# Patient Record
Sex: Female | Born: 1950 | Race: White | Hispanic: No | Marital: Married | Smoking: Former smoker
Health system: Southern US, Community
[De-identification: ages and names within clinical notes are randomized; demographics above are authoritative.]

## PROBLEM LIST (undated history)

## (undated) DIAGNOSIS — Z8639 Personal history of other endocrine, nutritional and metabolic disease: Secondary | ICD-10-CM

## (undated) DIAGNOSIS — C801 Malignant (primary) neoplasm, unspecified: Secondary | ICD-10-CM

## (undated) DIAGNOSIS — I10 Essential (primary) hypertension: Secondary | ICD-10-CM

## (undated) DIAGNOSIS — N952 Postmenopausal atrophic vaginitis: Secondary | ICD-10-CM

## (undated) DIAGNOSIS — E039 Hypothyroidism, unspecified: Secondary | ICD-10-CM

## (undated) HISTORY — PX: COLONOSCOPY: SHX174

## (undated) HISTORY — DX: Personal history of other endocrine, nutritional and metabolic disease: Z86.39

## (undated) HISTORY — PX: OTHER SURGICAL HISTORY: SHX169

## (undated) HISTORY — DX: Essential (primary) hypertension: I10

---
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
## 1978-04-09 HISTORY — PX: TREATMENT FISTULA ANAL: SUR1390

## 2002-04-09 HISTORY — PX: ABLATION: SHX5711

## 2004-06-23 ENCOUNTER — Ambulatory Visit: Payer: Self-pay

## 2005-04-09 DIAGNOSIS — Z8639 Personal history of other endocrine, nutritional and metabolic disease: Secondary | ICD-10-CM

## 2005-04-09 HISTORY — DX: Personal history of other endocrine, nutritional and metabolic disease: Z86.39

## 2005-08-16 ENCOUNTER — Ambulatory Visit: Payer: Self-pay | Admitting: Internal Medicine

## 2005-09-04 ENCOUNTER — Ambulatory Visit: Payer: Self-pay

## 2006-09-11 ENCOUNTER — Ambulatory Visit: Payer: Self-pay

## 2006-12-16 ENCOUNTER — Ambulatory Visit: Payer: Self-pay | Admitting: Internal Medicine

## 2007-06-04 ENCOUNTER — Ambulatory Visit: Payer: Self-pay | Admitting: Internal Medicine

## 2007-09-15 ENCOUNTER — Ambulatory Visit: Payer: Self-pay

## 2007-10-02 ENCOUNTER — Ambulatory Visit: Payer: Self-pay | Admitting: Gastroenterology

## 2008-07-01 ENCOUNTER — Ambulatory Visit: Payer: Self-pay

## 2008-09-16 ENCOUNTER — Ambulatory Visit: Payer: Self-pay

## 2009-03-22 ENCOUNTER — Ambulatory Visit: Payer: Self-pay | Admitting: Internal Medicine

## 2009-09-22 ENCOUNTER — Ambulatory Visit: Payer: Self-pay

## 2010-09-28 ENCOUNTER — Ambulatory Visit: Payer: Self-pay | Admitting: Internal Medicine

## 2011-10-02 ENCOUNTER — Ambulatory Visit: Payer: Self-pay | Admitting: Internal Medicine

## 2012-10-02 ENCOUNTER — Ambulatory Visit: Payer: Self-pay | Admitting: Internal Medicine

## 2013-10-06 ENCOUNTER — Ambulatory Visit: Payer: Self-pay | Admitting: Internal Medicine

## 2014-02-04 ENCOUNTER — Ambulatory Visit: Payer: Self-pay | Admitting: Internal Medicine

## 2014-10-05 ENCOUNTER — Other Ambulatory Visit: Payer: Self-pay | Admitting: Internal Medicine

## 2014-10-05 DIAGNOSIS — Z1239 Encounter for other screening for malignant neoplasm of breast: Secondary | ICD-10-CM

## 2014-10-12 ENCOUNTER — Ambulatory Visit
Admission: RE | Admit: 2014-10-12 | Discharge: 2014-10-12 | Disposition: A | Discharge status: Home / Self Care | Payer: BC Managed Care – PPO | Source: Ambulatory Visit | Attending: Internal Medicine | Admitting: Internal Medicine

## 2014-10-12 DIAGNOSIS — Z1239 Encounter for other screening for malignant neoplasm of breast: Secondary | ICD-10-CM

## 2014-10-12 DIAGNOSIS — Z1231 Encounter for screening mammogram for malignant neoplasm of breast: Secondary | ICD-10-CM | POA: Insufficient documentation

## 2015-07-25 ENCOUNTER — Other Ambulatory Visit: Payer: Self-pay | Admitting: Internal Medicine

## 2015-08-01 ENCOUNTER — Other Ambulatory Visit: Payer: Self-pay | Admitting: Internal Medicine

## 2015-08-01 DIAGNOSIS — Z1231 Encounter for screening mammogram for malignant neoplasm of breast: Secondary | ICD-10-CM

## 2015-10-13 ENCOUNTER — Ambulatory Visit
Admission: RE | Admit: 2015-10-13 | Discharge: 2015-10-13 | Disposition: A | Discharge status: Home / Self Care | Payer: Medicare Other | Source: Ambulatory Visit | Attending: Internal Medicine | Admitting: Internal Medicine

## 2015-10-13 DIAGNOSIS — Z1231 Encounter for screening mammogram for malignant neoplasm of breast: Secondary | ICD-10-CM

## 2016-08-01 ENCOUNTER — Other Ambulatory Visit: Payer: Self-pay | Admitting: Internal Medicine

## 2016-08-01 DIAGNOSIS — Z1231 Encounter for screening mammogram for malignant neoplasm of breast: Secondary | ICD-10-CM

## 2016-10-16 ENCOUNTER — Ambulatory Visit: Payer: Medicare Other

## 2016-10-16 ENCOUNTER — Ambulatory Visit
Admission: RE | Admit: 2016-10-16 | Discharge: 2016-10-16 | Disposition: A | Discharge status: Home / Self Care | Payer: Medicare Other | Source: Ambulatory Visit | Attending: Internal Medicine | Admitting: Internal Medicine

## 2016-10-16 DIAGNOSIS — Z1231 Encounter for screening mammogram for malignant neoplasm of breast: Secondary | ICD-10-CM | POA: Insufficient documentation

## 2016-10-16 HISTORY — DX: Malignant (primary) neoplasm, unspecified: C80.1

## 2016-12-06 ENCOUNTER — Ambulatory Visit (INDEPENDENT_AMBULATORY_CARE_PROVIDER_SITE_OTHER): Payer: Medicare Other | Admitting: Certified Nurse Midwife

## 2016-12-06 ENCOUNTER — Encounter: Payer: Self-pay | Admitting: Certified Nurse Midwife

## 2016-12-06 VITALS — BP 136/84 | HR 75 | Ht 61.5 in | Wt 152.9 lb

## 2016-12-06 DIAGNOSIS — R82998 Other abnormal findings in urine: Secondary | ICD-10-CM

## 2016-12-06 DIAGNOSIS — N898 Other specified noninflammatory disorders of vagina: Secondary | ICD-10-CM | POA: Diagnosis not present

## 2016-12-06 DIAGNOSIS — L292 Pruritus vulvae: Secondary | ICD-10-CM

## 2016-12-06 DIAGNOSIS — R8299 Other abnormal findings in urine: Secondary | ICD-10-CM | POA: Diagnosis not present

## 2016-12-06 MED ORDER — LIDOCAINE HCL 2 % EX GEL: 1.0000 "application " | CUTANEOUS | 2 refills | Status: DC | PRN

## 2016-12-06 MED ORDER — HYDROXYZINE HCL 25 MG PO TABS: 25.0000 mg | ORAL_TABLET | Freq: Four times a day (QID) | ORAL | 2 refills | Status: DC | PRN

## 2016-12-06 MED ORDER — CLOBETASOL PROPIONATE 0.05 % EX OINT: TOPICAL_OINTMENT | CUTANEOUS | 5 refills | Status: DC

## 2016-12-06 NOTE — Patient Instructions (Signed)
Lichen Sclerosus  Lichen sclerosus is a skin problem. It can happen on any part of the body, but it commonly involves the anal or genital areas. It can cause itching and discomfort in these areas. Treatment can help to control symptoms. When the genital area is affected, getting treatment is important because the condition can cause scarring that may lead to other problems.  What are the causes?  The cause of this condition is not known. It could be the result of an overactive immune system or a lack of certain hormones. Lichen sclerosus is not an infection or a fungus. It is not passed from one person to another (not contagious).  What increases the risk?  This condition is more likely to develop in women, usually after menopause.  What are the signs or symptoms?  Symptoms of this condition include:  · Thin, wrinkled, white areas on the skin.  · Thickened white areas on the skin.  · Red and swollen patches (lesions) on the skin.  · Tears or cracks in the skin.  · Bruising.  · Blood blisters.  · Severe itching.    You may also have pain, itching, or burning with urination. Constipation is also common in people with lichen sclerosus.  How is this diagnosed?  This condition may be diagnosed with a physical exam. In some cases, a tissue sample (biopsy sample) may be removed to be looked at under a microscope.  How is this treated?  This condition is usually treated with medicated creams or ointments (topical steroids) that are applied over the affected areas.  Follow these instructions at home:  · Take over-the-counter and prescription medicines only as told by your health care provider.  · Use creams or ointments as told by your health care provider.  · Do not scratch the affected areas of skin.  · Women should keep the vaginal area as clean and dry as possible.  · Keep all follow-up visits as told by your health care provider. This is important.  Contact a health care provider if:  · You have increasing redness,  swelling, or pain in the affected area.  · You have fluid, blood, or pus coming from the affected area.  · You have new lesions on your skin.  · You have a fever.  · You have pain during sex.  This information is not intended to replace advice given to you by your health care provider. Make sure you discuss any questions you have with your health care provider.  Document Released: 08/16/2010 Document Revised: 09/01/2015 Document Reviewed: 06/21/2014  Elsevier Interactive Patient Education © 2018 Elsevier Inc[employer]

## 2016-12-09 LAB — URINE CULTURE

## 2016-12-12 LAB — CANDIDA 6 SPECIES PROFILE, NAA
CANDIDA ALBICANS, NAA: NEGATIVE
CANDIDA GLABRATA, NAA: NEGATIVE
CANDIDA KRUSEI, NAA: NEGATIVE
CANDIDA LUSITANIAE, NAA: NEGATIVE
CANDIDA TROPICALIS, NAA: NEGATIVE
Candida parapsilosis, NAA: NEGATIVE

## 2016-12-12 LAB — BACTERIAL VAGINOSIS, NAA

## 2016-12-12 LAB — PATHOLOGY

## 2016-12-16 NOTE — Progress Notes (Signed)
GYN ENCOUNTER NOTE  Subjective:       Deborah Waters is a 66 y.o. [phone] female here for evaluation of vaginal dryness, itching and swelling for the last few months. Symptoms are unrelieved with home and medical treatment measures.   Treated for yeast and bacterial vaginosis by PCP. Also, prescribed Estrace cream.   Reports feeling normal for seven (7) to 10 days, but then the symptoms returned.   History significant for hypothyroidism.   Denies difficulty breathing or respiratory distress, chest pain, abdominal pain, vaginal bleeding, vaginal discharge, vaginal odor, dysuria, and leg pain or swelling.    Gynecologic History  No LMP recorded. Patient is postmenopausal.   Last mammogram: 10/2016. Results were: normal  Obstetric History  OB History  Gravida Para Term Preterm AB Living  3 2 2   1 2   SAB TAB Ectopic Multiple Live Births    1     2    # Outcome Date GA Lbr Len/2nd Weight Sex Delivery Anes PTL Lv  3 Term 06/09/82    M Vag-Spont   LIV  2 Term 10/05/76    F Vag-Spont   LIV  1 TAB 1975        ND      Past Medical History:  Diagnosis Date  . Cancer ([place])    skin ca-basal cell  . History of hypothyroidism 2007    Past Surgical History:  Procedure Laterality Date  . ABLATION  2004   uterine  . TONSILLECTOMY  1969  . TREATMENT FISTULA ANAL  1980   No Known Allergies  Social History   Social History  . Marital status: Married    Spouse name: N/A  . Number of children: N/A  . Years of education: N/A   Occupational History  . Not on file.   Social History Main Topics  . Smoking status: Former Research scientist (life sciences)  . Smokeless tobacco: Never Used  . Alcohol use 4.2 oz/week    7 Glasses of wine per week  . Drug use: No  . Sexual activity: Yes    Partners: Male   Other Topics Concern  . Not on file   Social History Narrative  . No narrative on file    Family History  Problem Relation Age of Onset  . Breast cancer Paternal Aunt 50  . Cancer Mother         bone  . Cancer Father        lung  . Heart failure Brother   . Hypertension Paternal Grandfather   . Stroke Paternal Grandfather     The following portions of the patient's history were reviewed and updated as appropriate: allergies, current medications, past family history, past medical history, past social history, past surgical history and problem list.  Review of Systems  Review of Systems - Negative except as noted above.  History obtained from the patient.   Objective:   BP 136/84   Pulse 75   Ht 5' 1.5" (1.562 m)   Wt 152 lb 14.4 oz (69.4 kg)   BMI 28.42 kg/m    CONSTITUTIONAL: Well-developed, well-nourished female in no acute distress.    SKIN: Skin is warm and dry. No rash noted. Not diaphoretic. No erythema. No pallor.  [place]: Alert and oriented to person, place, and time.   PSYCHIATRIC: Normal mood and affect. Normal behavior. Normal judgment and thought content.  ABDOMEN: Soft, non distended; Non tender.  No Organomegaly.  PELVIC:  External Genitalia: Erythema present  Vagina:  Erythema present   Assessment:   1. Vaginal irritation  - Candida 6 Species Profile, NAA - Bacterial Vaginosis, NAA - Pathology  2. Vulvar itching  - Candida 6 Species Profile, NAA - Bacterial Vaginosis, NAA - Pathology  3. Leukocytes in urine  - Urine Culture   Plan:   Labs: NuSwab, urine culture and punch biospy (see note below).  Rx: Clobetasol, hydroxyzine, lidocaine jelly; see orders.   Reviewed red flag symptoms and when to call.   RTC x 6 weeks for follow up or sooner if needed.    Diona Fanti, CNM   Procedure note:  After informed written consent was obtained, using Betadine for cleansing and 1% Lidocaine with epinephrine for anesthetic, with sterile technique a 3 mm punch biopsy was used to obtain a biopsy specimen of the vulvar. Hemostasis was obtained by pressure and silver nitrate. Be alert for any signs of cutaneous  infection. The specimen is labeled and sent to pathology for evaluation. The procedure was well tolerated without complications.   Diona Fanti, CNM

## 2016-12-17 ENCOUNTER — Encounter: Payer: Self-pay | Admitting: Certified Nurse Midwife

## 2017-01-03 ENCOUNTER — Encounter: Payer: Self-pay | Admitting: Certified Nurse Midwife

## 2017-01-04 NOTE — Telephone Encounter (Signed)
Ivin Booty, please contact patient. May come in earlier for me to look at spot. Thanks, JML

## 2017-01-07 ENCOUNTER — Telehealth: Payer: Self-pay | Admitting: Certified Nurse Midwife

## 2017-01-10 ENCOUNTER — Encounter: Payer: Self-pay | Admitting: Certified Nurse Midwife

## 2017-01-10 ENCOUNTER — Ambulatory Visit (INDEPENDENT_AMBULATORY_CARE_PROVIDER_SITE_OTHER): Payer: Medicare Other | Admitting: Certified Nurse Midwife

## 2017-01-10 VITALS — BP 155/97 | HR 70 | Wt 150.5 lb

## 2017-01-10 DIAGNOSIS — N9089 Other specified noninflammatory disorders of vulva and perineum: Secondary | ICD-10-CM | POA: Diagnosis not present

## 2017-01-10 DIAGNOSIS — R3915 Urgency of urination: Secondary | ICD-10-CM | POA: Diagnosis not present

## 2017-01-10 DIAGNOSIS — R3913 Splitting of urinary stream: Secondary | ICD-10-CM

## 2017-01-10 LAB — POCT URINALYSIS DIPSTICK
Bilirubin, UA: NEGATIVE
GLUCOSE UA: NEGATIVE
Ketones, UA: NEGATIVE
Nitrite, UA: NEGATIVE
Protein, UA: NEGATIVE
RBC UA: NEGATIVE
SPEC GRAV UA: 1.015 (ref 1.010–1.025)
Urobilinogen, UA: 0.2 E.U./dL
pH, UA: 6 (ref 5.0–8.0)

## 2017-01-11 ENCOUNTER — Encounter: Payer: Self-pay | Admitting: Certified Nurse Midwife

## 2017-01-14 LAB — ANAEROBIC AND AEROBIC CULTURE

## 2017-01-14 LAB — HERPES SIMPLEX VIRUS CULTURE

## 2017-01-16 NOTE — Progress Notes (Signed)
GYN ENCOUNTER NOTE  Subjective:       Deborah Waters is a 66 y.o. [phone] female here for gynecologic evaluation of vulvar lesion or pustule.   Reports doing well with clobestal ointment treatment until she noticed three (3) small pin head sized pustules to her vulva.   Denies difficulty breathing or respiratory distress, chest pain, abdominal pain, dysuria, vaginal bleeding, and leg pain or swelling.   Gynecologic History  No LMP recorded. Patient is postmenopausal.  Contraception: post menopausal status  Obstetric History  OB History  Gravida Para Term Preterm AB Living  3 2 2   1 2   SAB TAB Ectopic Multiple Live Births    1     2    # Outcome Date GA Lbr Len/2nd Weight Sex Delivery Anes PTL Lv  3 Term 06/09/82    M Vag-Spont   LIV  2 Term 10/05/76    F Vag-Spont   LIV  1 TAB 1975        ND      Past Medical History:  Diagnosis Date  . Cancer ([place])    skin ca-basal cell  . History of hypothyroidism 2007    Past Surgical History:  Procedure Laterality Date  . ABLATION  2004   uterine  . TONSILLECTOMY  1969  . TREATMENT FISTULA ANAL  1980    Current Outpatient Prescriptions on File Prior to Visit  Medication Sig Dispense Refill  . bisoprolol-hydrochlorothiazide (ZIAC) 5-6.25 MG tablet Take 1 tablet by mouth daily.    . clobetasol ointment (TEMOVATE) 0.05 % Apply to affected area every night for 4 weeks, then every other day for 4 weeks and then twice a week for 4 weeks or until resolution. 30 g 5  . estradiol (ESTRACE) 0.1 MG/GM vaginal cream Place 1 Applicatorful vaginally once a week.    . hydrOXYzine (ATARAX/VISTARIL) 25 MG tablet Take 1 tablet (25 mg total) by mouth every 6 (six) hours as needed for itching. 30 tablet 2  . lidocaine (XYLOCAINE) 2 % jelly Apply 1 application topically as needed. 30 mL 2   No current facility-administered medications on file prior to visit.     No Known Allergies  Social History   Social History  . Marital status:  Married    Spouse name: N/A  . Number of children: N/A  . Years of education: N/A   Occupational History  . Not on file.   Social History Main Topics  . Smoking status: Former Research scientist (life sciences)  . Smokeless tobacco: Never Used  . Alcohol use 4.2 oz/week    7 Glasses of wine per week  . Drug use: No  . Sexual activity: Yes    Partners: Male   Other Topics Concern  . Not on file   Social History Narrative  . No narrative on file    Family History  Problem Relation Age of Onset  . Breast cancer Paternal Aunt 59  . Cancer Mother        bone  . Cancer Father        lung  . Heart failure Brother   . Hypertension Paternal Grandfather   . Stroke Paternal Grandfather     The following portions of the patient's history were reviewed and updated as appropriate: allergies, current medications, past family history, past medical history, past social history, past surgical history and problem list.  Review of Systems  Review of Systems - Negative except as noted above.  History obtained from the patient.  Objective:   BP (!) 155/97   Pulse 70   Wt 150 lb 8 oz (68.3 kg)   BMI 27.98 kg/m    CONSTITUTIONAL: Well-developed, well-nourished female in no acute distress.   HENT:  Normocephalic, atraumatic.   NECK: Normal range of motion, supple, no masses.    SKIN: Skin is warm and dry. No rash noted. Not diaphoretic. No erythema. No pallor.  [place]: Alert and oriented to person, place, and time.   PSYCHIATRIC: Normal mood and affect. Normal behavior. Normal judgment and thought content.  CARDIOVASCULAR:Not Examined  RESPIRATORY: Not Examined  BREASTS: Not Examined  ABDOMEN: Soft, non distended; Non tender.  No Organomegaly.  PELVIC:   External Genitalia: Erythema noted, three pin head  sized pustules drained and culture obtained    Vagina: Normal   MUSCULOSKELETAL: Normal range of motion. No tenderness.  No cyanosis, clubbing, or edema.  Assessment:   1. Urgency of  urination - POCT urinalysis dipstick - Ambulatory referral to Urology  2. Urinary urgency  3. Urinary stream splitting - Ambulatory referral to Urology  4. Lesion of vulva - Anaerobic and Aerobic Culture - Herpes simplex virus culture   Plan:   Labs: Cultures obtained, see orders.   Continue medications as prescribed.   Reviewed red flag symptoms and when to call.   RTC as needed if symptoms worsen or fail to improve.    Diona Fanti, CNM

## 2017-01-17 ENCOUNTER — Encounter: Payer: Medicare Other | Admitting: Certified Nurse Midwife

## 2017-01-18 ENCOUNTER — Other Ambulatory Visit: Payer: Self-pay | Admitting: Certified Nurse Midwife

## 2017-01-18 MED ORDER — SULFAMETHOXAZOLE-TRIMETHOPRIM 800-160 MG PO TABS: 1.0000 | ORAL_TABLET | Freq: Two times a day (BID) | ORAL | 0 refills | Status: DC

## 2017-01-21 ENCOUNTER — Encounter: Payer: Self-pay | Admitting: Certified Nurse Midwife

## 2017-01-21 ENCOUNTER — Telehealth: Payer: Self-pay | Admitting: Certified Nurse Midwife

## 2017-01-21 NOTE — Telephone Encounter (Signed)
Patient called and lvm that she would like to have her prescription sent to [facility] in [place], because her preferred pharmacy is currently closed.  The patient would like to have a call back to confirm her medication problem. No other information was disclosed. Please advise.

## 2017-01-21 NOTE — Telephone Encounter (Signed)
Attempted to reach pt on other number not able to reach

## 2017-01-21 NOTE — Telephone Encounter (Signed)
Attempted to reach pt, but recording stating that call could not be completed. Will try to call pt back

## 2017-01-30 NOTE — Telephone Encounter (Signed)
Attempted to reach pt was unable to get through on phone

## 2017-02-04 NOTE — Telephone Encounter (Signed)
Will close message please see pt mychart message.

## 2017-02-07 ENCOUNTER — Other Ambulatory Visit: Payer: Self-pay

## 2017-02-07 DIAGNOSIS — R3915 Urgency of urination: Secondary | ICD-10-CM

## 2017-02-08 ENCOUNTER — Ambulatory Visit (INDEPENDENT_AMBULATORY_CARE_PROVIDER_SITE_OTHER): Payer: Medicare Other | Admitting: Urology

## 2017-02-08 ENCOUNTER — Other Ambulatory Visit
Admission: RE | Admit: 2017-02-08 | Discharge: 2017-02-08 | Disposition: A | Discharge status: Home / Self Care | Payer: Medicare Other | Source: Ambulatory Visit | Attending: Urology | Admitting: Urology

## 2017-02-08 ENCOUNTER — Encounter: Payer: Self-pay | Admitting: Urology

## 2017-02-08 VITALS — BP 137/71 | HR 68 | Ht 61.0 in | Wt 150.0 lb

## 2017-02-08 DIAGNOSIS — R32 Unspecified urinary incontinence: Secondary | ICD-10-CM

## 2017-02-08 DIAGNOSIS — N8111 Cystocele, midline: Secondary | ICD-10-CM | POA: Diagnosis not present

## 2017-02-08 DIAGNOSIS — R3915 Urgency of urination: Secondary | ICD-10-CM | POA: Diagnosis present

## 2017-02-08 DIAGNOSIS — N393 Stress incontinence (female) (male): Secondary | ICD-10-CM | POA: Diagnosis not present

## 2017-02-08 LAB — URINALYSIS, COMPLETE (UACMP) WITH MICROSCOPIC
BILIRUBIN URINE: NEGATIVE
Glucose, UA: NEGATIVE mg/dL
Ketones, ur: NEGATIVE mg/dL
NITRITE: NEGATIVE
PROTEIN: NEGATIVE mg/dL
Specific Gravity, Urine: 1.015 (ref 1.005–1.030)
pH: 6 (ref 5.0–8.0)

## 2017-02-08 LAB — BLADDER SCAN AMB NON-IMAGING

## 2017-02-08 NOTE — Progress Notes (Signed)
02/08/2017 3:22 PM   Deborah Waters Jul 02, 1950 [id number]  Referring provider: Albina Billet, MD [address]   [place], [place] [address]  Chief Complaint  Patient presents with  . Urinary Incontinence    New Patient    HPI: 66 year old female who presents today for further evaluation of stress urinary incontinence as well as urinary issues.  She reports that for many years, she had a very small amount of leakage of urine with laughing coughing, sneezing, and exercise.  Is not been bothersome to her.  She underwent endometrial ablation sometime ago and after that, started wearing a panty liner.  She believes her pantiliner may have been mildly damp but this is not bother her.  More recently, she has had vulvar irritation which has been evaluated and treated by GYN.  Because of this, she stopped wearing her panty liner thinking this may be exacerbating her symptoms.  Since then, she is noticed that her panties are damp and she was worried about this.    Additionally today, she reports that after voiding, she has a sensation of a "bubble" at her urethra with termination.  After a few seconds, the sensation goes away.  During this time when she feels the "bubble" she dribbles a little bit of urine.  This is not particularly bothersome to her but she was asked to see urology for further evaluation of this.  She does have a history of fairly significant vaginal dryness.  She was started on topical estrogen cream which she uses half applicatorful twice a week which is helpful.  She denies any pain with intercourse.  She does still have an intact uterus.  She had 2 vaginal deliveries.  Dysuria or gross hematuria.  No UTIs.  No bowel issues.   PMH: Past Medical History:  Diagnosis Date  . Cancer ([place])    skin ca-basal cell  . History of hypothyroidism 2007  . Hypertension     Surgical History: Past Surgical History:  Procedure Laterality Date  . ABLATION  2004   uterine  . TONSILLECTOMY  1969  . TREATMENT FISTULA ANAL  1980    Home Medications:  Allergies as of 02/08/2017   No Known Allergies     Medication List       Accurate as of 02/08/17  3:22 PM. Always use your most recent med list.          bisoprolol-hydrochlorothiazide 5-6.25 MG tablet Commonly known as:  ZIAC Take 1 tablet by mouth daily.   calcium-vitamin D 250-100 MG-UNIT tablet Take 1 tablet by mouth 2 (two) times daily.   estradiol 0.1 MG/GM vaginal cream Commonly known as:  ESTRACE Place 1 Applicatorful vaginally once a week.   ibuprofen 100 MG tablet Commonly known as:  ADVIL,MOTRIN Take 100 mg by mouth every 6 (six) hours as needed for fever.       Allergies: No Known Allergies  Family History: Family History  Problem Relation Age of Onset  . Breast cancer Paternal Aunt 37  . Cancer Mother        bone  . Cancer Father        lung  . Heart failure Brother   . Hypertension Paternal Grandfather   . Stroke Paternal Grandfather     Social History:  reports that she has quit smoking. She has never used smokeless tobacco. She reports that she drinks about 4.2 oz of alcohol per week . She reports that she does not use drugs.  ROS: UROLOGY Frequent Urination?: No Hard to postpone urination?: No Burning/pain with urination?: No Get up at night to urinate?: Yes Leakage of urine?: Yes Urine stream starts and stops?: No Trouble starting stream?: No Do you have to strain to urinate?: No Blood in urine?: No Urinary tract infection?: No Sexually transmitted disease?: No Injury to kidneys or bladder?: No Painful intercourse?: No Weak stream?: No Currently pregnant?: No Vaginal bleeding?: No Last menstrual period?: n  Gastrointestinal Nausea?: No Vomiting?: No Indigestion/heartburn?: No Diarrhea?: No Constipation?: No  Constitutional Fever: No Night sweats?: No Weight loss?: No Fatigue?: No  Skin Skin rash/lesions?: Yes Itching?:  Yes  Eyes Blurred vision?: No Double vision?: No  Ears/Nose/Throat Sore throat?: No Sinus problems?: Yes  Hematologic/Lymphatic Swollen glands?: No Easy bruising?: No  Cardiovascular Leg swelling?: No Chest pain?: No  Respiratory Cough?: No Shortness of breath?: No  Endocrine Excessive thirst?: No  Musculoskeletal Back pain?: No Joint pain?: Yes  Neurological Headaches?: No Dizziness?: Yes  Psychologic Depression?: No Anxiety?: No  Physical Exam: BP 137/71   Pulse 68   Ht 5\' 1"  (1.549 m)   Wt 150 lb (68 kg)   BMI 28.34 kg/m   Constitutional:  Alert and oriented, No acute distress. HEENT: [place] AT, moist mucus membranes.  Trachea midline, no masses. Cardiovascular: No clubbing, cyanosis, or edema. Respiratory: Normal respiratory effort, no increased work of breathing. GI: Abdomen is soft, nontender, nondistended, no abdominal masses GU: No CVA tenderness.  Pelvic: Normal external genitalia.  Normal urethra which is hypermobile, significant movement but no demonstrable stress incontinence with Valsalva.  She does have fairly significant anterior descent, to the level of the vaginal introitus  Skin: No rashes, bruises or suspicious lesions. Neurologic: Grossly intact, no focal deficits, moving all 4 extremities. Psychiatric: Normal mood and affect.  Laboratory Data: n/a  Urinalysis  Component     Latest Ref Rng & Units 02/08/2017  Color, Urine     YELLOW YELLOW  Appearance     CLEAR HAZY (A)  Specific Gravity, Urine     1.005 - 1.030 1.015  pH     5.0 - 8.0 6.0  Glucose     NEGATIVE mg/dL NEGATIVE  Hgb urine dipstick     NEGATIVE TRACE (A)  Bilirubin Urine     NEGATIVE NEGATIVE  Ketones, ur     NEGATIVE mg/dL NEGATIVE  Protein     NEGATIVE mg/dL NEGATIVE  Nitrite     NEGATIVE NEGATIVE  Leukocytes, UA     NEGATIVE LARGE (A)  Squamous Epithelial / LPF     NONE SEEN TOO NUMEROUS TO COUNT (A)  WBC, UA     0 - 5 WBC/hpf 6-30  RBC / HPF      0 - 5 RBC/hpf 0-5  Bacteria, UA     NONE SEEN FEW (A)   Pertinent Imaging: Results for orders placed or performed in visit on 02/08/17  BLADDER SCAN AMB NON-IMAGING  Result Value Ref Range   Scan Result 10ml     Assessment & Plan:    1. Stress incontinence Mild stress urinary incontinence, minimal bother We discussed options for management of this including physical therapy, Kegel exercises, and surgical intervention Weight loss was also discussed At this point in time, she was given information on pelvic floor exercises which she will start at home She will let us know she like to pursue physical therapy She is not interested in surgical intervention - BLADDER SCAN AMB NON-IMAGING - Urine culture; Future  2. Cystocele, midline Fairly significant cystocele with Valsalva Suspect the pressure and a "bubble" sensation she feels near the end of voiding is descent of her bladder to the introitus which resolves with decreased intra-abdominal pressure Discussed options for cystocele including conservative management, pessary, and surgical intervention She preferred conservative management at this time He does ever become interested in pursuing other alternative treatments, recommend following up with Dr. Matilde Sprang  F/u prn  Hollice Espy, MD  [facility] [address], [place], [place] [address] [phone]

## 2017-03-28 ENCOUNTER — Ambulatory Visit: Payer: Medicare Other | Admitting: Certified Nurse Midwife

## 2017-03-28 ENCOUNTER — Encounter: Payer: Self-pay | Admitting: Certified Nurse Midwife

## 2017-03-28 VITALS — BP 151/82 | HR 73 | Ht 61.0 in | Wt 155.7 lb

## 2017-03-28 DIAGNOSIS — L292 Pruritus vulvae: Secondary | ICD-10-CM | POA: Diagnosis not present

## 2017-03-28 DIAGNOSIS — N9089 Other specified noninflammatory disorders of vulva and perineum: Secondary | ICD-10-CM | POA: Diagnosis not present

## 2017-03-28 DIAGNOSIS — N898 Other specified noninflammatory disorders of vagina: Secondary | ICD-10-CM

## 2017-03-28 MED ORDER — METRONIDAZOLE 500 MG PO TABS: 500.0000 mg | ORAL_TABLET | Freq: Two times a day (BID) | ORAL | 0 refills | Status: AC

## 2017-03-28 MED ORDER — ESTRADIOL 0.1 MG/GM VA CREA: 1.0000 | TOPICAL_CREAM | Freq: Every day | VAGINAL | 6 refills | Status: DC

## 2017-03-28 NOTE — Patient Instructions (Signed)
Atrophic Vaginitis Atrophic vaginitis is when the tissues that line the vagina become dry and thin. This is caused by a drop in estrogen. Estrogen helps:  To keep the vagina moist.  To make a clear fluid that helps: ? To lubricate the vagina for sex. ? To protect the vagina from infection.  If the lining of the vagina is dry and thin, it may:  Make sex painful. It may also cause bleeding.  Cause a feeling of: ? Burning. ? Irritation. ? Itchiness.  Make an exam of your vagina painful. It may also cause bleeding.  Make you lose interest in sex.  Cause a burning feeling when you pee.  Make your vaginal fluid (discharge) brown or yellow.  For some women, there are no symptoms. This condition is most common in women who do not get their regular menstrual periods anymore (menopause). This often starts when a woman is 74-53 years old. Follow these instructions at home:  Take medicines only as told by your doctor. Do not use any herbal or alternative medicines unless your doctor says it is okay.  Use over-the-counter products for dryness only as told by your doctor. These include: ? Creams. ? Lubricants. ? Moisturizers.  Do not douche.  Do not use products that can make your vagina dry. These include: ? Scented feminine sprays. ? Scented tampons. ? Scented soaps.  If it hurts to have sex, tell your sexual partner. Contact a doctor if:  Your discharge looks different than normal.  Your vagina has an unusual smell.  You have new symptoms.  Your symptoms do not get better with treatment.  Your symptoms get worse. This information is not intended to replace advice given to you by your health care provider. Make sure you discuss any questions you have with your health care provider. Document Released: 09/12/2007 Document Revised: 09/01/2015 Document Reviewed: 03/17/2014 Elsevier Interactive Patient Education  2018 [employer]. Vaginitis Vaginitis is an inflammation of  the vagina. It can happen when the normal bacteria and yeast in the vagina grow too much. There are different types. Treatment will depend on the type you have. Follow these instructions at home:  Take all medicines as told by your doctor.  Keep your vagina area clean and dry. Avoid soap. Rinse the area with water.  Avoid washing and cleaning out the vagina (douching).  Do not use tampons or have sex (intercourse) until your treatment is done.  Wipe from front to back after going to the restroom.  Wear cotton underwear.  Avoid wearing underwear while you sleep until your vaginitis is gone.  Avoid tight pants. Avoid underwear or nylons without a cotton panel.  Take off wet clothing (such as a bathing suit) as soon as you can.  Use mild, unscented products. Avoid fabric softeners and scented: ? Feminine sprays. ? Laundry detergents. ? Tampons. ? Soaps or bubble baths.  Practice safe sex and use condoms. Get help right away if:  You have belly (abdominal) pain.  You have a fever or lasting symptoms for more than 2-3 days.  You have a fever and your symptoms suddenly get worse. This information is not intended to replace advice given to you by your health care provider. Make sure you discuss any questions you have with your health care provider. Document Released: 06/22/2008 Document Revised: 09/01/2015 Document Reviewed: 09/06/2011 Elsevier Interactive Patient Education  2017 [employer]. Metronidazole tablets or capsules What is this medicine? METRONIDAZOLE (me troe NI da zole) is an antiinfective. It  is used to treat certain kinds of bacterial and protozoal infections. It will not work for colds, flu, or other viral infections. This medicine may be used for other purposes; ask your health care provider or pharmacist if you have questions. COMMON BRAND NAME(S): Flagyl What should I tell my health care provider before I take this medicine? They need to know if you have any  of these conditions: -anemia or other blood disorders -disease of the nervous system -fungal or yeast infection -if you drink alcohol containing drinks -liver disease -seizures -an unusual or allergic reaction to metronidazole, or other medicines, foods, dyes, or preservatives -pregnant or trying to get pregnant -breast-feeding How should I use this medicine? Take this medicine by mouth with a full glass of water. Follow the directions on the prescription label. Take your medicine at regular intervals. Do not take your medicine more often than directed. Take all of your medicine as directed even if you think you are better. Do not skip doses or stop your medicine early. Talk to your pediatrician regarding the use of this medicine in children. Special care may be needed. Overdosage: If you think you have taken too much of this medicine contact a poison control center or emergency room at once. NOTE: This medicine is only for you. Do not share this medicine with others. What if I miss a dose? If you miss a dose, take it as soon as you can. If it is almost time for your next dose, take only that dose. Do not take double or extra doses. What may interact with this medicine? Do not take this medicine with any of the following medications: -alcohol or any product that contains alcohol -amprenavir oral solution -cisapride -disulfiram -dofetilide -dronedarone -paclitaxel injection -pimozide -ritonavir oral solution -sertraline oral solution -sulfamethoxazole-trimethoprim injection -thioridazine -ziprasidone This medicine may also interact with the following medications: -birth control pills -cimetidine -lithium -other medicines that prolong the QT interval (cause an abnormal heart rhythm) -phenobarbital -phenytoin -warfarin This list may not describe all possible interactions. Give your health care provider a list of all the medicines, herbs, non-prescription drugs, or dietary  supplements you use. Also tell them if you smoke, drink alcohol, or use illegal drugs. Some items may interact with your medicine. What should I watch for while using this medicine? Tell your doctor or health care professional if your symptoms do not improve or if they get worse. You may get drowsy or dizzy. Do not drive, use machinery, or do anything that needs mental alertness until you know how this medicine affects you. Do not stand or sit up quickly, especially if you are an older patient. This reduces the risk of dizzy or fainting spells. Avoid alcoholic drinks while you are taking this medicine and for three days afterward. Alcohol may make you feel dizzy, sick, or flushed. If you are being treated for a sexually transmitted disease, avoid sexual contact until you have finished your treatment. Your sexual partner may also need treatment. What side effects may I notice from receiving this medicine? Side effects that you should report to your doctor or health care professional as soon as possible: -allergic reactions like skin rash or hives, swelling of the face, lips, or tongue -confusion, clumsiness -difficulty speaking -discolored or sore mouth -dizziness -fever, infection -numbness, tingling, pain or weakness in the hands or feet -trouble passing urine or change in the amount of urine -redness, blistering, peeling or loosening of the skin, including inside the mouth -seizures -unusually weak or  tired -vaginal irritation, dryness, or discharge Side effects that usually do not require medical attention (report to your doctor or health care professional if they continue or are bothersome): -diarrhea -headache -irritability -metallic taste -nausea -stomach pain or cramps -trouble sleeping This list may not describe all possible side effects. Call your doctor for medical advice about side effects. You may report side effects to FDA at 1-800-FDA-1088. Where should I keep my  medicine? Keep out of the reach of children. Store at room temperature below 25 degrees C (77 degrees F). Protect from light. Keep container tightly closed. Throw away any unused medicine after the expiration date. NOTE: This sheet is a summary. It may not cover all possible information. If you have questions about this medicine, talk to your doctor, pharmacist, or health care provider.  2018 Elsevier/Gold Standard (2012-10-31 14:08:39)

## 2017-03-30 NOTE — Progress Notes (Signed)
GYN ENCOUNTER NOTE  Subjective:       Deborah Waters is a 66 y.o. [phone] female here for gynecologic evaluation of the following issues:  1. Vaginal discharge 2. Vaginal/vulvar irritation 3. Vaginal dryness 4. Vulvar itching  Reports itching and yellow, sticky discharge for the last 11 days. Attempted treatment with OTC Monistat x two (2) episodes with minimal relief of symptoms.   Questions current Estrace dosing.   Denies difficulty breathing or respiratory distress, chest pain, abdominal pain, vaginal bleeding, dysuria, and leg pain or swelling.    Gynecologic History  No LMP recorded. Patient is postmenopausal.   Last mammogram: 10/2016. Results were: normal.   Obstetric History  OB History  Gravida Para Term Preterm AB Living  3 2 2   1 2   SAB TAB Ectopic Multiple Live Births    1     2    # Outcome Date GA Lbr Len/2nd Weight Sex Delivery Anes PTL Lv  3 Term 06/09/82    M Vag-Spont   LIV  2 Term 10/05/76    F Vag-Spont   LIV  1 TAB 1975        ND      Past Medical History:  Diagnosis Date  . Cancer ([place])    skin ca-basal cell  . History of hypothyroidism 2007  . Hypertension     Past Surgical History:  Procedure Laterality Date  . ABLATION  2004   uterine  . TONSILLECTOMY  1969  . TREATMENT FISTULA ANAL  1980    Current Outpatient Medications on File Prior to Visit  Medication Sig Dispense Refill  . bisoprolol-hydrochlorothiazide (ZIAC) 5-6.25 MG tablet Take 1 tablet by mouth daily.    . calcium-vitamin D 250-100 MG-UNIT tablet Take 1 tablet by mouth 2 (two) times daily.    Marland Kitchen ibuprofen (ADVIL,MOTRIN) 100 MG tablet Take 100 mg by mouth every 6 (six) hours as needed for fever.    . Psyllium (METAMUCIL FIBER PO) Take by mouth.     No current facility-administered medications on file prior to visit.     No Known Allergies  Social History   Socioeconomic History  . Marital status: Married    Spouse name: Not on file  . Number of children:  Not on file  . Years of education: Not on file  . Highest education level: Not on file  Social Needs  . Financial resource strain: Not on file  . Food insecurity - worry: Not on file  . Food insecurity - inability: Not on file  . Transportation needs - medical: Not on file  . Transportation needs - non-medical: Not on file  Occupational History  . Not on file  Tobacco Use  . Smoking status: Former Research scientist (life sciences)  . Smokeless tobacco: Never Used  Substance and Sexual Activity  . Alcohol use: Yes    Alcohol/week: 4.2 oz    Types: 7 Glasses of wine per week  . Drug use: No  . Sexual activity: Yes    Partners: Male  Other Topics Concern  . Not on file  Social History Narrative  . Not on file    Family History  Problem Relation Age of Onset  . Breast cancer Paternal Aunt 9  . Cancer Mother        bone  . Cancer Father        lung  . Heart failure Brother   . Hypertension Paternal Grandfather   . Stroke Paternal Grandfather  The following portions of the patient's history were reviewed and updated as appropriate: allergies, current medications, past family history, past medical history, past social history, past surgical history and problem list.  Review of Systems Review of Systems - Negative except as noted above. History obtained from the patient.  Objective:   BP (!) 151/82 (BP Location: Left Arm, Patient Position: Sitting, Cuff Size: Normal)   Pulse 73   Ht 5\' 1"  (1.549 m)   Wt 155 lb 11.2 oz (70.6 kg)   BMI 29.42 kg/m    CONSTITUTIONAL: Well-developed, well-nourished female in no acute distress.   HENT:  Normocephalic, atraumatic.   NECK: Normal range of motion, supple, no masses.  Normal thyroid.   SKIN: Skin is warm and dry. No rash noted. Not diaphoretic. No erythema. No pallor.  [place]: Alert and oriented to person, place, and time.   PSYCHIATRIC: Normal mood and affect. Normal behavior. Normal judgment and thought content.  ABDOMEN: Soft, non  distended; Non tender.  No Organomegaly.  PELVIC:  External Genitalia: Erythema noted  Vagina: Discharge present, positive whiff test  Cervix: Normal  Uterus: Normal size, shape,consistency, mobile  Adnexa: Normal   MUSCULOSKELETAL: Normal range of motion. No tenderness.  No cyanosis, clubbing, or edema.  Assessment:   1. Vaginal discharge  - NuSwab Vaginitis (VG)  2. Vaginal irritation  - NuSwab Vaginitis (VG) - estradiol (ESTRACE) 0.1 MG/GM vaginal cream; Place 1 Applicatorful vaginally at bedtime.  Dispense: 42.5 g; Refill: 6  3. Vaginal dryness   4. Vulvar irritation   5. Vulvar itching  - estradiol (ESTRACE) 0.1 MG/GM vaginal cream; Place 1 Applicatorful vaginally at bedtime.  Dispense: 42.5 g; Refill: 6   Plan:   Labs: NuSwab, see orders.   Discussed usage options for estrace cream including current regimen vs advised regimen.   Encouraged home vaginal health techniques.   Rx: Estrace, see orders.   Rx: Flagyl, see orders.   Reviewed red flag symptoms and when to call.   RTC if symptoms worsen or fail to improve.    Diona Fanti, CNM [facility], [facility]

## 2017-04-03 LAB — NUSWAB VAGINITIS (VG)
CANDIDA ALBICANS, NAA: NEGATIVE
Candida glabrata, NAA: NEGATIVE
Trich vag by NAA: NEGATIVE

## 2017-04-10 ENCOUNTER — Encounter: Payer: Self-pay | Admitting: Certified Nurse Midwife

## 2017-04-15 ENCOUNTER — Encounter: Payer: Self-pay | Admitting: Certified Nurse Midwife

## 2017-04-17 ENCOUNTER — Other Ambulatory Visit: Payer: Self-pay

## 2017-04-17 DIAGNOSIS — N898 Other specified noninflammatory disorders of vagina: Secondary | ICD-10-CM

## 2017-04-17 DIAGNOSIS — L292 Pruritus vulvae: Secondary | ICD-10-CM

## 2017-04-17 MED ORDER — ESTRADIOL 0.1 MG/GM VA CREA: 1.0000 | TOPICAL_CREAM | Freq: Every day | VAGINAL | 0 refills | Status: AC

## 2017-04-22 ENCOUNTER — Telehealth: Payer: Self-pay | Admitting: Certified Nurse Midwife

## 2017-04-22 NOTE — Telephone Encounter (Signed)
Pt states her estrace cream is a tier 3. A 90 day supply is 192.00. If she can get it switched to a tier 1 it will only be 80.00 for a 90 days supply. Advised pt to have insurance company fax over a tier exception form and I will fill it out. Pt given our fax number.

## 2017-04-22 NOTE — Telephone Encounter (Signed)
Patient called stating she needs a nurse to call her insurance to request a tier exception for estradiol so she can get it cheaper. She would like for you to call her to explain the situation. Thanks

## 2017-04-25 ENCOUNTER — Other Ambulatory Visit: Payer: Self-pay

## 2017-04-25 MED ORDER — ESTROGENS, CONJUGATED 0.625 MG/GM VA CREA: 0.5000 | TOPICAL_CREAM | VAGINAL | 1 refills | Status: DC

## 2017-06-26 ENCOUNTER — Encounter: Payer: Self-pay | Admitting: Certified Nurse Midwife

## 2017-06-27 NOTE — Telephone Encounter (Signed)
Error

## 2017-07-02 ENCOUNTER — Telehealth: Payer: Self-pay | Admitting: Obstetrics and Gynecology

## 2017-07-02 MED ORDER — ESTROGENS, CONJUGATED 0.625 MG/GM VA CREA: 0.5000 | TOPICAL_CREAM | VAGINAL | 1 refills | Status: AC

## 2017-07-02 NOTE — Telephone Encounter (Signed)
The patient called and stated that she would like to speak with Crystal M. No other information was disclosed. Please advise.

## 2017-07-02 NOTE — Telephone Encounter (Signed)
Pt states she has been using the premarin 2 x a week. The other days she has been using lavender and coconut oil. She states she does not leave home d/t the mess the she makes in her underwear. She states there has got to be something else. Advised pt to look into replens. She is wondering should she take estrogen po. Advised appt- made for 07/12/2017. Refill of premarin erx.

## 2017-07-12 ENCOUNTER — Encounter: Payer: Medicare Other | Admitting: Certified Nurse Midwife

## 2017-07-29 ENCOUNTER — Encounter: Payer: Self-pay | Admitting: Certified Nurse Midwife

## 2017-07-29 ENCOUNTER — Ambulatory Visit (INDEPENDENT_AMBULATORY_CARE_PROVIDER_SITE_OTHER): Payer: Medicare Other | Admitting: Certified Nurse Midwife

## 2017-07-29 VITALS — BP 150/89 | HR 68 | Ht 61.0 in | Wt 153.9 lb

## 2017-07-29 DIAGNOSIS — N898 Other specified noninflammatory disorders of vagina: Secondary | ICD-10-CM

## 2017-07-29 NOTE — Progress Notes (Signed)
Pt has noticed dryness for about 1 year. Pt has been using coconut oil but noticed that she is using it every 2-3 for relief of dryness if she has clothes on but when she don't clothes dry her vaginal area out.

## 2017-07-29 NOTE — Patient Instructions (Signed)
Atrophic Vaginitis Atrophic vaginitis is when the tissues that line the vagina become dry and thin. This is caused by a drop in estrogen. Estrogen helps:  To keep the vagina moist.  To make a clear fluid that helps: ? To lubricate the vagina for sex. ? To protect the vagina from infection.  If the lining of the vagina is dry and thin, it may:  Make sex painful. It may also cause bleeding.  Cause a feeling of: ? Burning. ? Irritation. ? Itchiness.  Make an exam of your vagina painful. It may also cause bleeding.  Make you lose interest in sex.  Cause a burning feeling when you pee.  Make your vaginal fluid (discharge) brown or yellow.  For some women, there are no symptoms. This condition is most common in women who do not get their regular menstrual periods anymore (menopause). This often starts when a woman is 45-55 years old. Follow these instructions at home:  Take medicines only as told by your doctor. Do not use any herbal or alternative medicines unless your doctor says it is okay.  Use over-the-counter products for dryness only as told by your doctor. These include: ? Creams. ? Lubricants. ? Moisturizers.  Do not douche.  Do not use products that can make your vagina dry. These include: ? Scented feminine sprays. ? Scented tampons. ? Scented soaps.  If it hurts to have sex, tell your sexual partner. Contact a doctor if:  Your discharge looks different than normal.  Your vagina has an unusual smell.  You have new symptoms.  Your symptoms do not get better with treatment.  Your symptoms get worse. This information is not intended to replace advice given to you by your health care provider. Make sure you discuss any questions you have with your health care provider. Document Released: 09/12/2007 Document Revised: 09/01/2015 Document Reviewed: 03/17/2014 Elsevier Interactive Patient Education  2018 Elsevier Inc[employer]  

## 2017-07-31 LAB — NUSWAB VAGINITIS (VG)
CANDIDA ALBICANS, NAA: NEGATIVE
CANDIDA GLABRATA, NAA: NEGATIVE
TRICH VAG BY NAA: NEGATIVE

## 2017-08-04 NOTE — Progress Notes (Signed)
GYN ENCOUNTER NOTE  Subjective:       Deborah Waters is a 67 y.o. [phone] female is here for gynecologic evaluation of the following issues:  1. Vaginal dryness 2. Vaginal itching 3. Vaginal irritation  Reports symptoms despite treatment measures. Applying coconut oil approximately four (4) times a day. Denies noticeable difference using Premarin or Estrace.   Denies difficulty breathing or respiratory distress, chest pain, abdominal pain, vaginal bleeding, dysuria, and leg pain or swelling.    Gynecologic History  No LMP recorded. Patient is postmenopausal.  Last mammogram: 10/2016. Results were: normal  Obstetric History OB History  Gravida Para Term Preterm AB Living  3 2 2   1 2   SAB TAB Ectopic Multiple Live Births    1     2    # Outcome Date GA Lbr Len/2nd Weight Sex Delivery Anes PTL Lv  3 Term 06/09/82    M Vag-Spont   LIV  2 Term 10/05/76    F Vag-Spont   LIV  1 TAB 1975        ND    Past Medical History:  Diagnosis Date  . Cancer ([place])    skin ca-basal cell  . History of hypothyroidism 2007  . Hypertension     Past Surgical History:  Procedure Laterality Date  . ABLATION  2004   uterine  . TONSILLECTOMY  1969  . TREATMENT FISTULA ANAL  1980    Current Outpatient Medications on File Prior to Visit  Medication Sig Dispense Refill  . bisoprolol-hydrochlorothiazide (ZIAC) 5-6.25 MG tablet Take 1 tablet by mouth daily.    . calcium-vitamin D 250-100 MG-UNIT tablet Take 1 tablet by mouth 2 (two) times daily.    Marland Kitchen conjugated estrogens (PREMARIN) vaginal cream Place 0.5 Applicatorfuls vaginally 2 (two) times a week. 90 g 1  . estradiol (ESTRACE) 0.1 MG/GM vaginal cream Place 1 Applicatorful vaginally at bedtime. 127.8 g 0  . ibuprofen (ADVIL,MOTRIN) 100 MG tablet Take 100 mg by mouth every 6 (six) hours as needed for fever.    . Multiple Vitamin (MULTIVITAMIN) tablet Take 1 tablet by mouth daily.    . Omega-3 Fatty Acids (FISH OIL) 1000 MG CAPS Take by  mouth.    . Psyllium (METAMUCIL FIBER PO) Take by mouth.     No current facility-administered medications on file prior to visit.     No Known Allergies  Social History   Socioeconomic History  . Marital status: Married    Spouse name: Not on file  . Number of children: Not on file  . Years of education: Not on file  . Highest education level: Not on file  Occupational History  . Not on file  Social Needs  . Financial resource strain: Not on file  . Food insecurity:    Worry: Not on file    Inability: Not on file  . Transportation needs:    Medical: Not on file    Non-medical: Not on file  Tobacco Use  . Smoking status: Former Research scientist (life sciences)  . Smokeless tobacco: Never Used  Substance and Sexual Activity  . Alcohol use: Yes    Alcohol/week: 4.2 oz    Types: 7 Glasses of wine per week  . Drug use: No  . Sexual activity: Yes    Partners: Male  Lifestyle  . Physical activity:    Days per week: Not on file    Minutes per session: Not on file  . Stress: Not on file  Relationships  .  Social connections:    Talks on phone: Not on file    Gets together: Not on file    Attends religious service: Not on file    Active member of club or organization: Not on file    Attends meetings of clubs or organizations: Not on file    Relationship status: Not on file  . Intimate partner violence:    Fear of current or ex partner: Not on file    Emotionally abused: Not on file    Physically abused: Not on file    Forced sexual activity: Not on file  Other Topics Concern  . Not on file  Social History Narrative  . Not on file    Family History  Problem Relation Age of Onset  . Breast cancer Paternal Aunt 58  . Cancer Mother        bone  . Cancer Father        lung  . Heart failure Brother   . Hypertension Paternal Grandfather   . Stroke Paternal Grandfather     The following portions of the patient's history were reviewed and updated as appropriate: allergies, current  medications, past family history, past medical history, past social history, past surgical history and problem list.  Review of Systems  Review of Systems - Negative except as noted above.  History obtained from the patient   Objective:   BP (!) 150/89   Pulse 68   Ht 5\' 1"  (1.549 m)   Wt 153 lb 14.4 oz (69.8 kg)   BMI 29.08 kg/m    CONSTITUTIONAL: Well-developed, well-nourished female in no acute distress.   HENT:  Normocephalic, atraumatic.   [place]: Alert and oriented to person, place, and time.   PSYCHIATRIC: Normal mood and affect. Normal behavior. Normal judgment and thought content.  PELVIC:  External Genitalia: Erythema noted  Vagina: Thick, white discharge present  Cervix: Normal   MUSCULOSKELETAL: Normal range of motion. No tenderness.  No cyanosis, clubbing, or edema.  Assessment:   1. Vaginal dryness  - NuSwab Vaginitis (VG)  2. Vaginal itching  - NuSwab Vaginitis (VG)  3. Vaginal irritation   Plan:   Patient would like to take oral estrogen for treatment of symptoms due to limited relief with vaginal estrogen.   Labs: NuSwab, will contact patient with results.   Advised patient would consult Dr. Marcelline Mates regarding best oral treatment for vaginal dryness then contact patient with medication options.   Reviewed red flag symptoms and when to call.   RTC as needed.    Diona Fanti, CNM [facility], [facility]

## 2017-08-05 ENCOUNTER — Encounter: Payer: Self-pay | Admitting: Certified Nurse Midwife

## 2017-08-15 ENCOUNTER — Encounter: Payer: Self-pay | Admitting: Certified Nurse Midwife

## 2017-08-16 ENCOUNTER — Other Ambulatory Visit: Payer: Self-pay | Admitting: Internal Medicine

## 2017-08-16 DIAGNOSIS — Z1231 Encounter for screening mammogram for malignant neoplasm of breast: Secondary | ICD-10-CM

## 2017-08-20 ENCOUNTER — Telehealth: Payer: Self-pay | Admitting: Obstetrics and Gynecology

## 2017-08-20 MED ORDER — OSPEMIFENE 60 MG PO TABS: 60.0000 mg | ORAL_TABLET | Freq: Every day | ORAL | 2 refills | Status: AC

## 2017-08-20 NOTE — Telephone Encounter (Signed)
Rx sent to [facility]- mail order. Pt given the number to [facility]. She is advised to contact them and tell them she is a cash paying pt. Per coupon card pt is to get 90 day supply for 80.00. Pt will call me back if any issues. Will give samples to tide pt over until rx arrives. Pt very appreciative of call and help.

## 2017-08-20 NOTE — Telephone Encounter (Signed)
Patient called requesting to speak with you regarding a medication.Thanks

## 2017-10-17 ENCOUNTER — Ambulatory Visit
Admission: RE | Admit: 2017-10-17 | Discharge: 2017-10-17 | Disposition: A | Discharge status: Home / Self Care | Payer: Medicare Other | Source: Ambulatory Visit | Attending: Internal Medicine | Admitting: Internal Medicine

## 2017-10-17 DIAGNOSIS — Z1231 Encounter for screening mammogram for malignant neoplasm of breast: Secondary | ICD-10-CM | POA: Insufficient documentation

## 2018-05-29 ENCOUNTER — Ambulatory Visit
Admission: RE | Admit: 2018-05-29 | Discharge: 2018-05-29 | Disposition: A | Discharge status: Home / Self Care | Payer: Medicare Other | Attending: Gastroenterology | Admitting: Gastroenterology

## 2018-05-29 ENCOUNTER — Encounter: Payer: Self-pay | Admitting: Student

## 2018-05-29 ENCOUNTER — Ambulatory Visit: Payer: Medicare Other | Admitting: Anesthesiology

## 2018-05-29 ENCOUNTER — Encounter
Admission: RE | Disposition: A | Discharge status: Home / Self Care | Payer: Self-pay | Source: Home / Self Care | Attending: Gastroenterology

## 2018-05-29 ENCOUNTER — Other Ambulatory Visit: Payer: Self-pay

## 2018-05-29 DIAGNOSIS — Z79890 Hormone replacement therapy (postmenopausal): Secondary | ICD-10-CM | POA: Insufficient documentation

## 2018-05-29 DIAGNOSIS — Z1211 Encounter for screening for malignant neoplasm of colon: Secondary | ICD-10-CM | POA: Insufficient documentation

## 2018-05-29 DIAGNOSIS — Z79899 Other long term (current) drug therapy: Secondary | ICD-10-CM | POA: Insufficient documentation

## 2018-05-29 DIAGNOSIS — N952 Postmenopausal atrophic vaginitis: Secondary | ICD-10-CM | POA: Insufficient documentation

## 2018-05-29 DIAGNOSIS — K621 Rectal polyp: Secondary | ICD-10-CM | POA: Diagnosis not present

## 2018-05-29 DIAGNOSIS — K635 Polyp of colon: Secondary | ICD-10-CM | POA: Insufficient documentation

## 2018-05-29 DIAGNOSIS — K5730 Diverticulosis of large intestine without perforation or abscess without bleeding: Secondary | ICD-10-CM | POA: Insufficient documentation

## 2018-05-29 DIAGNOSIS — I10 Essential (primary) hypertension: Secondary | ICD-10-CM | POA: Diagnosis not present

## 2018-05-29 DIAGNOSIS — Z87891 Personal history of nicotine dependence: Secondary | ICD-10-CM | POA: Diagnosis not present

## 2018-05-29 HISTORY — DX: Hypothyroidism, unspecified: E03.9

## 2018-05-29 HISTORY — DX: Postmenopausal atrophic vaginitis: N95.2

## 2018-05-29 HISTORY — PX: COLONOSCOPY WITH PROPOFOL: SHX5780

## 2018-05-29 SURGERY — COLONOSCOPY WITH PROPOFOL
Anesthesia: General

## 2018-05-29 MED ORDER — PROPOFOL 500 MG/50ML IV EMUL
INTRAVENOUS | Status: AC
  Filled 2018-05-29: qty 50

## 2018-05-29 MED ORDER — PROPOFOL 500 MG/50ML IV EMUL
INTRAVENOUS | Status: DC | PRN
  Administered 2018-05-29: 110 ug/kg/min via INTRAVENOUS

## 2018-05-29 MED ORDER — SODIUM CHLORIDE 0.9 % IV SOLN
INTRAVENOUS | Status: DC
  Administered 2018-05-29: 09:00:00 via INTRAVENOUS

## 2018-05-29 MED ORDER — PROPOFOL 10 MG/ML IV BOLUS
INTRAVENOUS | Status: DC | PRN
  Administered 2018-05-29: 80 mg via INTRAVENOUS

## 2018-05-29 NOTE — Anesthesia Preprocedure Evaluation (Signed)
Anesthesia Evaluation  Patient identified by MRN, date of birth, ID band Patient awake    Reviewed: Allergy & Precautions, H&P , NPO status , Patient's Chart, lab work & pertinent test results, reviewed documented beta blocker date and time   Airway Mallampati: II   Neck ROM: full    Dental  (+) Poor Dentition   Pulmonary neg pulmonary ROS, former smoker,    Pulmonary exam normal        Cardiovascular Exercise Tolerance: Good hypertension, On Medications negative cardio ROS Normal cardiovascular exam Rhythm:regular Rate:Normal     Neuro/Psych negative neurological ROS  negative psych ROS   GI/Hepatic negative GI ROS, Neg liver ROS,   Endo/Other  negative endocrine ROSHypothyroidism   Renal/GU negative Renal ROS  negative genitourinary   Musculoskeletal   Abdominal   Peds  Hematology negative hematology ROS (+)   Anesthesia Other Findings Past Medical History: No date: Cancer ([place])     Comment:  skin ca-basal cell 2007: History of hypothyroidism No date: Hypertension No date: Hypothyroidism No date: Vaginal atrophy Past Surgical History: 2004: ABLATION     Comment:  uterine No date: COLONOSCOPY 1969: TONSILLECTOMY 1980: TREATMENT FISTULA ANAL No date: uterine ablation BMI    Body Mass Index:  27.96 kg/m     Reproductive/Obstetrics negative OB ROS                             Anesthesia Physical Anesthesia Plan  ASA: II  Anesthesia Plan: General   Post-op Pain Management:    Induction:   PONV Risk Score and Plan:   Airway Management Planned:   Additional Equipment:   Intra-op Plan:   Post-operative Plan:   Informed Consent: I have reviewed the patients History and Physical, chart, labs and discussed the procedure including the risks, benefits and alternatives for the proposed anesthesia with the patient or authorized representative who has indicated his/her  understanding and acceptance.     Dental Advisory Given  Plan Discussed with: CRNA  Anesthesia Plan Comments:         Anesthesia Quick Evaluation

## 2018-05-29 NOTE — H&P (Signed)
Outpatient short stay form Pre-procedure 05/29/2018 8:38 AM Lollie Sails MD  Primary Physician: Dr. Benita Stabile  Reason for visit: Colonoscopy  History of present illness: Patient is a 68-year-old female presenting today for colonoscopy.  Her last procedure was 2009.  No family history of colon polyps or colon cancer patient had no polyps on that procedure.  She denies any problems with rectal bleeding abdominal pain or diarrhea.  Tolerating her prep well.  She takes no aspirin or blood thinning agent.    Current Facility-Administered Medications:  .  0.9 %  sodium chloride infusion, , Intravenous, Continuous, Lollie Sails, MD, Last Rate: 20 mL/hr at 05/29/18 8343  Medications Prior to Admission  Medication Sig Dispense Refill Last Dose  . bisoprolol-hydrochlorothiazide (ZIAC) 5-6.25 MG tablet Take 1 tablet by mouth daily.   05/29/2018 at 0600  . calcium-vitamin D 250-100 MG-UNIT tablet Take 1 tablet by mouth 2 (two) times daily.   05/28/2018 at Unknown time  . ibuprofen (ADVIL,MOTRIN) 100 MG tablet Take 100 mg by mouth every 6 (six) hours as needed for fever.   Past Week at Unknown time  . Multiple Vitamin (MULTIVITAMIN) tablet Take 1 tablet by mouth daily.   Past Week at Unknown time  . Psyllium (METAMUCIL FIBER PO) Take by mouth.   Past Week at Unknown time  . conjugated estrogens (PREMARIN) vaginal cream Place 0.5 Applicatorfuls vaginally 2 (two) times a week. 90 g 1 Taking  . estradiol (ESTRACE) 0.1 MG/GM vaginal cream Place 1 Applicatorful vaginally at bedtime. 127.8 g 0 Taking  . Omega-3 Fatty Acids (FISH OIL) 1000 MG CAPS Take by mouth.   Not Taking at Unknown time  . Ospemifene (OSPHENA) 60 MG TABS Take 60 mg by mouth daily. (Patient not taking: Reported on 05/29/2018) 90 tablet 2 Not Taking at Unknown time     No Known Allergies   Past Medical History:  Diagnosis Date  . Cancer ([place])    skin ca-basal cell  . History of hypothyroidism 2007  . Hypertension   .  Hypothyroidism   . Vaginal atrophy     Review of systems:      Physical Exam    Heart and lungs: Rhythm without rub or gallop, lungs are bilaterally clear.    HEENT: Normocephalic atraumatic eyes are anicteric    Other:    Pertinant exam for procedure: Soft nontender nondistended bowel sounds positive normoactive    Planned proceedures: Colonoscopy and indicated procedures. I have discussed the risks benefits and complications of procedures to include not limited to bleeding, infection, perforation and the risk of sedation and the patient wishes to proceed.    Lollie Sails, MD Gastroenterology 05/29/2018  8:38 AM

## 2018-05-29 NOTE — Op Note (Signed)
[facility] Gastroenterology Patient Name: Deborah Waters Procedure Date: 05/29/2018 8:36 AM MRN: [id number] Account #: [id number] Date of Birth: 1950/09/21 Admit Type: Outpatient Age: 68 Room: [facility] ENDO ROOM 1 Gender: Female Note Status: Finalized Procedure:            Colonoscopy Indications:          Screening for colorectal malignant neoplasm Providers:            Lollie Sails, MD Referring MD:         Leona Carry. Hall Busing, MD (Referring MD) Medicines:            Monitored Anesthesia Care Complications:        No immediate complications. Procedure:            Pre-Anesthesia Assessment:                       - ASA Grade Assessment: II - A patient with mild                        systemic disease.                       After obtaining informed consent, the colonoscope was                        passed under direct vision. Throughout the procedure,                        the patient's blood pressure, pulse, and oxygen                        saturations were monitored continuously. The                        Colonoscope was introduced through the anus and                        advanced to the the cecum, identified by appendiceal                        orifice and ileocecal valve. The colonoscopy was                        performed without difficulty. The patient tolerated the                        procedure well. The quality of the bowel preparation                        was good. Findings:      Four sessile polyps were found in the rectum. The polyps were 1 to 2 mm       in size. These polyps were removed with a cold biopsy forceps. Resection       and retrieval were complete.      Four sessile polyps were found in the distal sigmoid colon. The polyps       were 1 to 2 mm in size. These polyps were removed with a cold biopsy       forceps. Resection and retrieval were complete.      A 1 mm polyp was found in  the cecum. The polyp was sessile. The polyp   was removed with a cold biopsy forceps. Resection and retrieval were       complete.      Multiple small-mouthed diverticula were found in the sigmoid colon and       descending colon.      The retroflexed view of the distal rectum and anal verge was normal and       showed no anal or rectal abnormalities.      The digital rectal exam was normal. Impression:           - Four 1 to 2 mm polyps in the rectum, removed with a                        cold biopsy forceps. Resected and retrieved.                       - Four 1 to 2 mm polyps in the distal sigmoid colon,                        removed with a cold biopsy forceps. Resected and                        retrieved.                       - One 1 mm polyp in the cecum, removed with a cold                        biopsy forceps. Resected and retrieved.                       - Diverticulosis in the sigmoid colon and in the                        descending colon.                       - The distal rectum and anal verge are normal on                        retroflexion view. Recommendation:       - Discharge patient to home.                       - Advance diet as tolerated.                       - Await pathology results.                       - Telephone GI clinic for pathology results in 5 days. Procedure Code(s):    --- Professional ---                       [phone], Colonoscopy, flexible; with biopsy, single or                        multiple Diagnosis Code(s):    --- Professional ---                       Z12.11, Encounter for  screening for malignant neoplasm                        of colon                       K62.1, Rectal polyp                       D12.5, Benign neoplasm of sigmoid colon                       D12.0, Benign neoplasm of cecum                       K57.30, Diverticulosis of large intestine without                        perforation or abscess without bleeding CPT copyright 2018 [employer]. All  rights reserved. The codes documented in this report are preliminary and upon coder review may  be revised to meet current compliance requirements. Lollie Sails, MD 05/29/2018 9:24:59 AM This report has been signed electronically. Number of Addenda: 0 Note Initiated On: 05/29/2018 8:36 AM Scope Withdrawal Time: 0 hours 14 minutes 1 second  Total Procedure Duration: 0 hours 23 minutes 47 seconds       [facility]

## 2018-05-29 NOTE — Anesthesia Post-op Follow-up Note (Signed)
Anesthesia QCDR form completed.        

## 2018-05-29 NOTE — Transfer of Care (Signed)
Immediate Anesthesia Transfer of Care Note  Patient: Deborah Waters  Procedure(s) Performed: COLONOSCOPY WITH PROPOFOL (N/A )  Patient Location: Endoscopy Unit  Anesthesia Type:General  Level of Consciousness: drowsy and patient cooperative  Airway & Oxygen Therapy: Patient Spontanous Breathing and Patient connected to nasal cannula oxygen  Post-op Assessment: Report given to RN and Post -op Vital signs reviewed and stable  Post vital signs: Reviewed and stable  Last Vitals:  Vitals Value Taken Time  BP 97/68 05/29/2018  9:28 AM  Temp 36.2 C 05/29/2018  9:28 AM  Pulse 70 05/29/2018  9:34 AM  Resp 19 05/29/2018  9:34 AM  SpO2 96 % 05/29/2018  9:34 AM  Vitals shown include unvalidated device data.  Last Pain:  Vitals:   05/29/18 0928  TempSrc: Tympanic  PainSc: 0-No pain         Complications: No apparent anesthesia complications

## 2018-05-30 ENCOUNTER — Encounter: Payer: Self-pay | Admitting: Gastroenterology

## 2018-05-30 LAB — SURGICAL PATHOLOGY

## 2018-05-30 NOTE — Anesthesia Postprocedure Evaluation (Signed)
Anesthesia Post Note  Patient: Deborah Waters  Procedure(s) Performed: COLONOSCOPY WITH PROPOFOL (N/A )  Patient location during evaluation: PACU Anesthesia Type: General Level of consciousness: awake and alert Pain management: pain level controlled Vital Signs Assessment: post-procedure vital signs reviewed and stable Respiratory status: spontaneous breathing, nonlabored ventilation, respiratory function stable and patient connected to nasal cannula oxygen Cardiovascular status: blood pressure returned to baseline and stable Postop Assessment: no apparent nausea or vomiting Anesthetic complications: no     Last Vitals:  Vitals:   05/29/18 0948 05/29/18 0958  BP: 105/67 114/66  Pulse: 65 61  Resp: 16 13  Temp:    SpO2: 94% 97%    Last Pain:  Vitals:   05/30/18 0731  TempSrc:   PainSc: 0-No pain                 Molli Barrows

## 2019-02-17 ENCOUNTER — Other Ambulatory Visit: Payer: Self-pay | Admitting: Internal Medicine

## 2019-02-17 DIAGNOSIS — Z1231 Encounter for screening mammogram for malignant neoplasm of breast: Secondary | ICD-10-CM

## 2019-05-20 ENCOUNTER — Ambulatory Visit
Admission: RE | Admit: 2019-05-20 | Discharge: 2019-05-20 | Disposition: A | Discharge status: Home / Self Care | Payer: Medicare PPO | Source: Ambulatory Visit | Attending: Internal Medicine | Admitting: Internal Medicine

## 2019-05-20 ENCOUNTER — Other Ambulatory Visit: Payer: Self-pay

## 2019-05-20 DIAGNOSIS — Z1231 Encounter for screening mammogram for malignant neoplasm of breast: Secondary | ICD-10-CM | POA: Diagnosis present

## 2019-08-08 DEATH — deceased

## 2020-08-12 IMAGING — MG DIGITAL SCREENING BILAT W/ TOMO W/ CAD
8 series · 8 of 24 positions shown · non-contrast
Comparison: Previous exam(s).

CLINICAL DATA: Screening.

EXAM:
DIGITAL SCREENING BILATERAL MAMMOGRAM WITH TOMO AND CAD

[R MLO synth-2D]
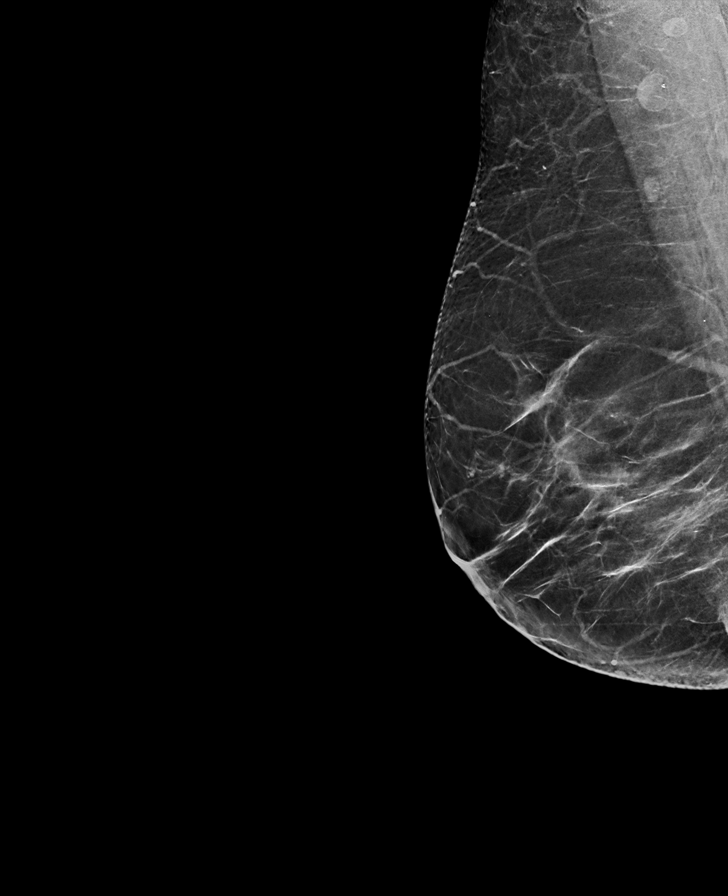

[R CC synth-2D]
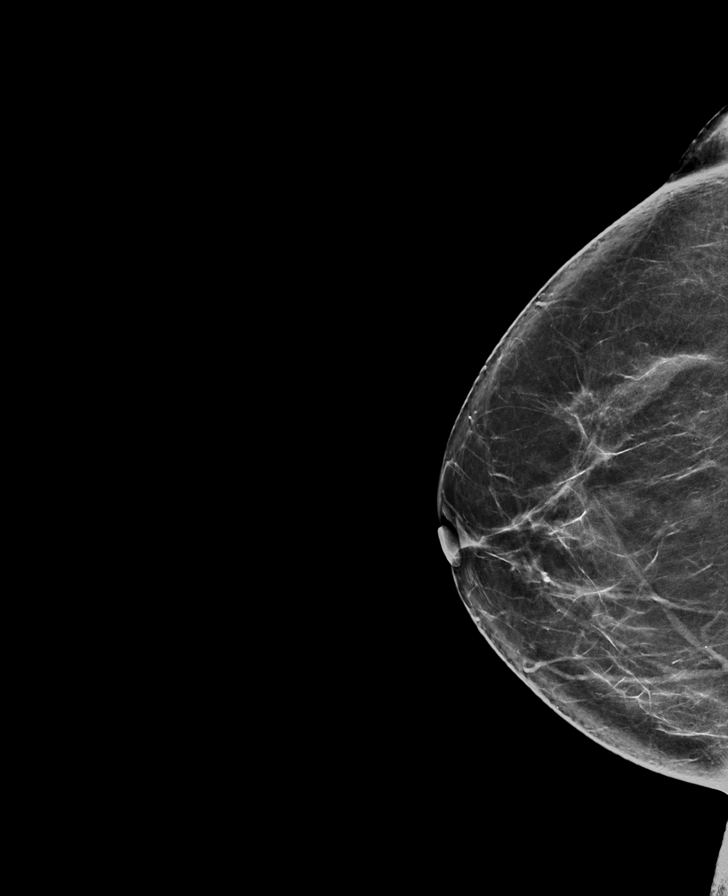

[L CC synth-2D]
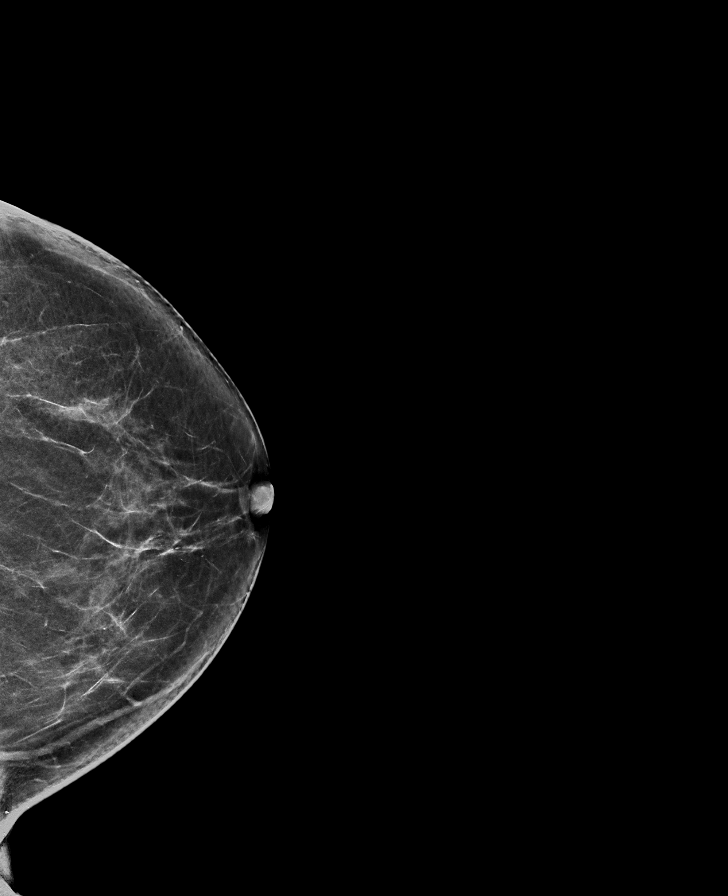

[L MLO synth-2D]
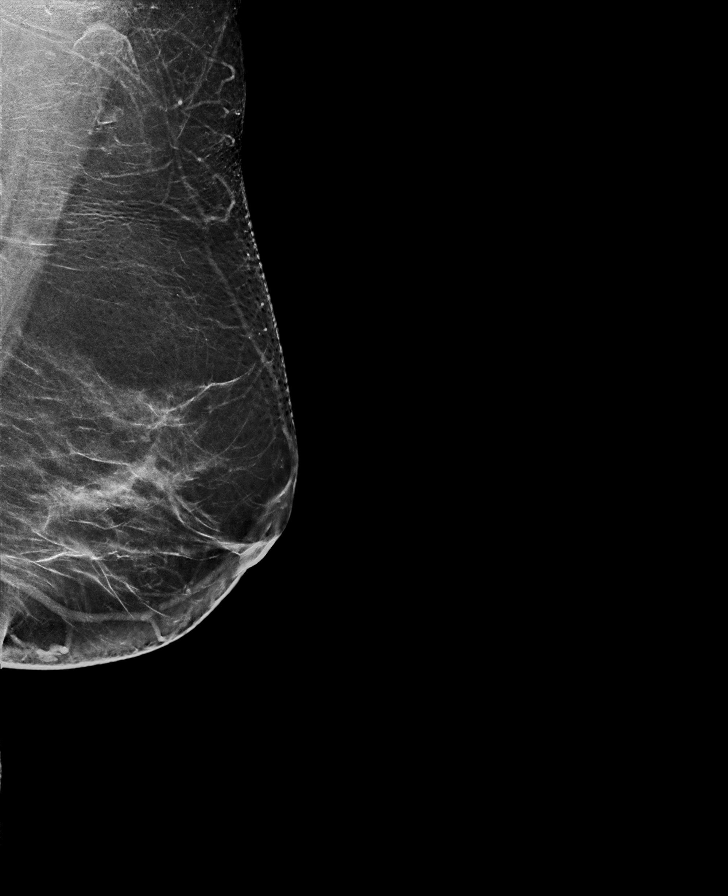

[R MLO tomo · tomo slice 37/72.0]
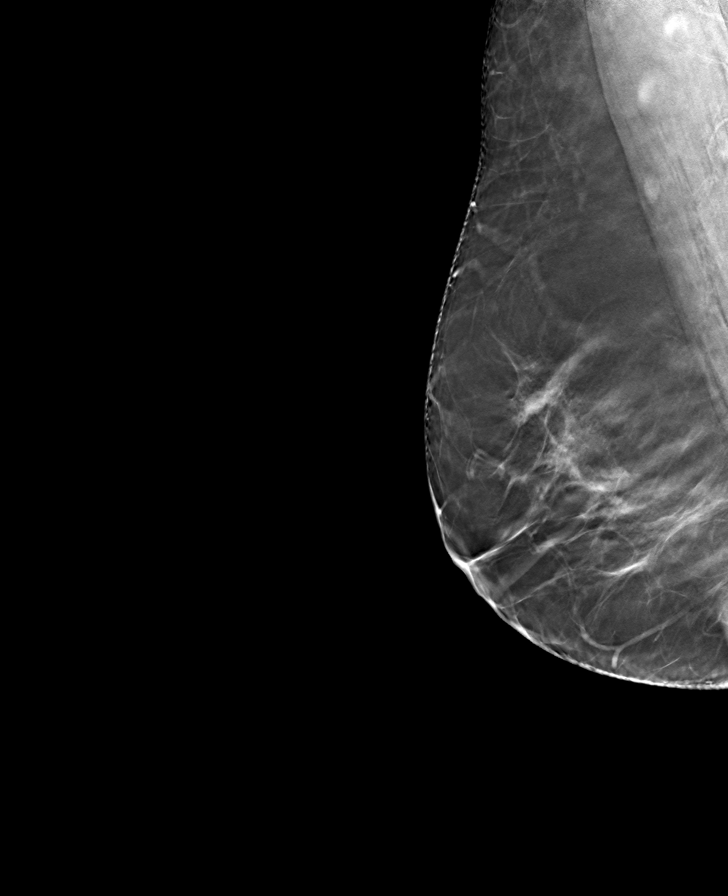

[L CC tomo · tomo slice 35/70.0]
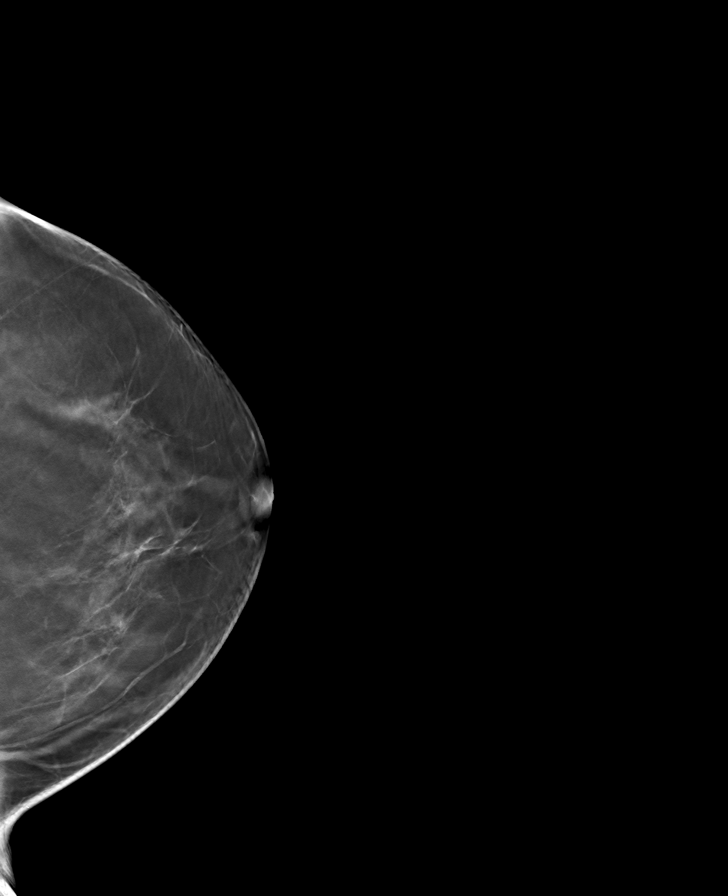

[L MLO tomo · tomo slice 38/75.0]
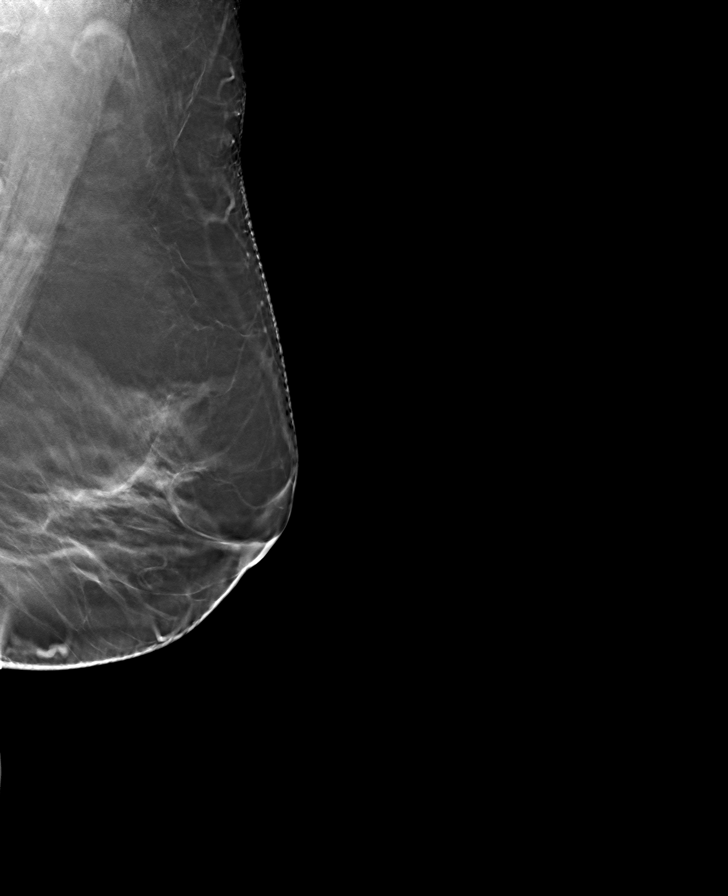

[R CC tomo · tomo slice 37/72.0]
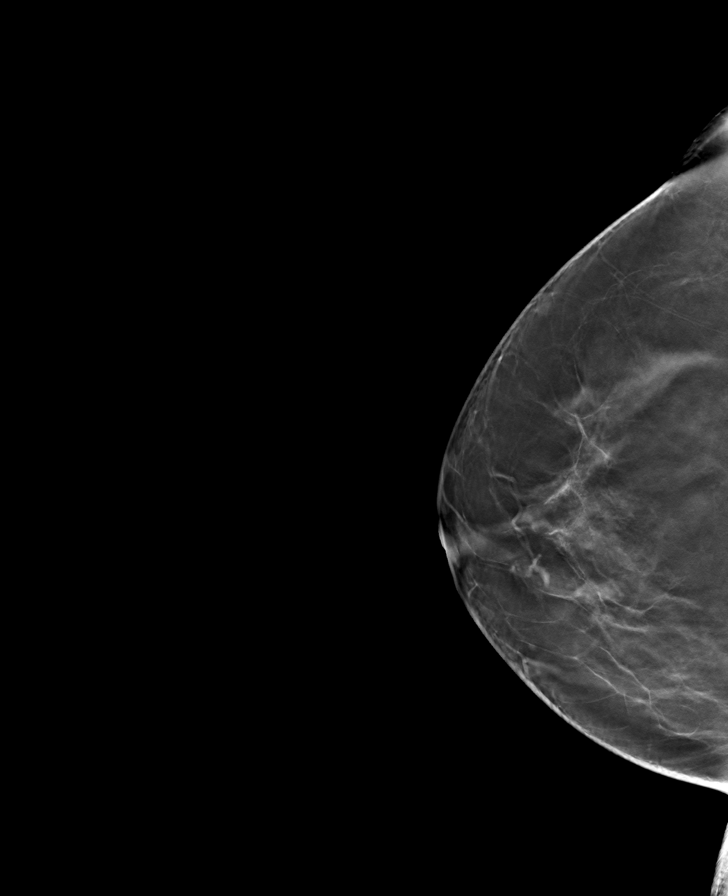

[8 of 24 positions shown; findings below may reference images not displayed]

ACR Breast Density Category b: There are scattered areas of
fibroglandular density.
FINDINGS: There are no findings suspicious for malignancy. Images were
processed with CAD.
IMPRESSION: No mammographic evidence of malignancy. A result letter of this
screening mammogram will be mailed directly to the patient.

RECOMMENDATION:
Screening mammogram in one year. (Code:CN-U-775)

BI-RADS CATEGORY  1: Negative.

## 2021-11-30 ENCOUNTER — Other Ambulatory Visit: Payer: Self-pay | Admitting: Internal Medicine

## 2021-11-30 DIAGNOSIS — Z1231 Encounter for screening mammogram for malignant neoplasm of breast: Secondary | ICD-10-CM

## 2021-12-05 ENCOUNTER — Ambulatory Visit
Admission: RE | Admit: 2021-12-05 | Discharge: 2021-12-05 | Disposition: A | Discharge status: Home / Self Care | Payer: Medicare PPO | Source: Ambulatory Visit | Attending: Internal Medicine | Admitting: Internal Medicine

## 2021-12-05 DIAGNOSIS — Z1231 Encounter for screening mammogram for malignant neoplasm of breast: Secondary | ICD-10-CM | POA: Diagnosis present

## 2022-11-01 ENCOUNTER — Other Ambulatory Visit: Payer: Self-pay

## 2022-11-01 ENCOUNTER — Emergency Department
Admission: EM | Admit: 2022-11-01 | Discharge: 2022-11-01 | Disposition: A | Discharge status: Home / Self Care | Payer: Medicare PPO | Attending: Emergency Medicine | Admitting: Emergency Medicine

## 2022-11-01 DIAGNOSIS — N390 Urinary tract infection, site not specified: Secondary | ICD-10-CM | POA: Diagnosis not present

## 2022-11-01 DIAGNOSIS — R531 Weakness: Secondary | ICD-10-CM

## 2022-11-01 DIAGNOSIS — I10 Essential (primary) hypertension: Secondary | ICD-10-CM | POA: Insufficient documentation

## 2022-11-01 LAB — URINALYSIS, ROUTINE W REFLEX MICROSCOPIC
Bilirubin Urine: NEGATIVE
Glucose, UA: NEGATIVE mg/dL
Ketones, ur: NEGATIVE mg/dL
Nitrite: NEGATIVE
Protein, ur: NEGATIVE mg/dL
Specific Gravity, Urine: 1.004 — ABNORMAL LOW (ref 1.005–1.030)
pH: 5 (ref 5.0–8.0)

## 2022-11-01 LAB — BASIC METABOLIC PANEL
Anion gap: 8 (ref 5–15)
BUN: 13 mg/dL (ref 8–23)
CO2: 29 mmol/L (ref 22–32)
Calcium: 8.8 mg/dL — ABNORMAL LOW (ref 8.9–10.3)
Chloride: 100 mmol/L (ref 98–111)
Creatinine, Ser: 0.77 mg/dL (ref 0.44–1.00)
GFR, Estimated: >60 mL/min (ref >60)
Glucose, Bld: 112 mg/dL — ABNORMAL HIGH (ref 70–99)
Potassium: 3.6 mmol/L (ref 3.5–5.1)
Sodium: 137 mmol/L (ref 135–145)

## 2022-11-01 LAB — CBC
HCT: 44.9 % (ref 36.0–46.0)
Hemoglobin: 15 g/dL (ref 12.0–15.0)
MCH: 28.6 pg (ref 26.0–34.0)
MCHC: 33.4 g/dL (ref 30.0–36.0)
MCV: 85.5 fL (ref 80.0–100.0)
Platelets: 367 10*3/uL (ref 150–400)
RBC: 5.25 MIL/uL — ABNORMAL HIGH (ref 3.87–5.11)
RDW: 13.1 % (ref 11.5–15.5)
WBC: 8.6 10*3/uL (ref 4.0–10.5)
nRBC: 0 % (ref 0.0–0.2)

## 2022-11-01 LAB — TROPONIN I (HIGH SENSITIVITY): Troponin I (High Sensitivity): 5 ng/L (ref <18)

## 2022-11-01 MED ORDER — FOSFOMYCIN TROMETHAMINE 3 G PO PACK: 3.0000 g | PACK | Freq: Once | ORAL | 0 refills | Status: AC

## 2022-11-01 NOTE — ED Triage Notes (Signed)
Pt sts that she has been having weird s/s since the 16th of July. Pt sts that she has been having scalp pain as well as extreme fatigue. Pt sts that she thought that her tooth infection was causing the s/s but the dentist assured her that was not it.

## 2022-11-01 NOTE — ED Provider Notes (Signed)
[facility] Provider Note   Event Date/Time   First MD Initiated Contact with Patient 11/01/22 1329     (approximate) History  Weakness  HPI Deborah Waters is a 72 y.o. female who presents complaining of scalp tingling and bilateral upper and lower extremity tingling over the past few days.  Patient states that she was seen for a tooth infection a few weeks ago that she thought was causing the symptoms.  Patient was then placed on antibiotics that she thought was causing the symptoms.  Patient was also noted to have high blood pressure here today and seems to correlate with the symptoms. ROS: Patient currently denies any vision changes, tinnitus, difficulty speaking, facial droop, sore throat, chest pain, shortness of breath, abdominal pain, nausea/vomiting/diarrhea, dysuria, or weakness/numbness in any extremity   Physical Exam  Triage Vital Signs: ED Triage Vitals [11/01/22 1246]  Encounter Vitals Group     BP (!) 135/93     Systolic BP Percentile      Diastolic BP Percentile      Pulse Rate 73     Resp 17     Temp 98 F (36.7 C)     Temp Source Oral     SpO2 94 %     Weight 150 lb (68 kg)     Height 5\' 1"  (1.549 m)     Head Circumference      Peak Flow      Pain Score 0     Pain Loc      Pain Education      Exclude from Growth Chart    Most recent vital signs: Vitals:   11/01/22 1246 11/01/22 1450  BP: (!) 135/93 (!) 163/82  Pulse: 73 65  Resp: 17 18  Temp: 98 F (36.7 C)   SpO2: 94% 96%   General: Awake, oriented x4. CV:  Good peripheral perfusion.  Resp:  Normal effort.  Abd:  No distention.  Other:  Elderly overweight Caucasian female laying in bed in no acute distress.  NIHSS 0 ED Results / Procedures / Treatments  Labs (all labs ordered are listed, but only abnormal results are displayed) Labs Reviewed  BASIC METABOLIC PANEL - Abnormal; Notable for the following components:      Result Value   Glucose, Bld 112 (*)     Calcium 8.8 (*)    All other components within normal limits  CBC - Abnormal; Notable for the following components:   RBC 5.25 (*)    All other components within normal limits  URINALYSIS, ROUTINE W REFLEX MICROSCOPIC - Abnormal; Notable for the following components:   Color, Urine STRAW (*)    APPearance CLEAR (*)    Specific Gravity, Urine 1.004 (*)    Hgb urine dipstick SMALL (*)    Leukocytes,Ua LARGE (*)    Bacteria, UA RARE (*)    All other components within normal limits  TROPONIN I (HIGH SENSITIVITY)   EKG ED ECG REPORT I, Merwyn Katos, the attending physician, personally viewed and interpreted this ECG. Date: 11/01/2022 EKG Time: 1248 Rate: 64 Rhythm: normal sinus rhythm QRS Axis: normal Intervals: normal ST/T Wave abnormalities: normal Narrative Interpretation: no evidence of acute ischemia PROCEDURES: Critical Care performed: No Procedures MEDICATIONS ORDERED IN ED: Medications - No data to display IMPRESSION / MDM / ASSESSMENT AND PLAN / ED COURSE  I reviewed the triage vital signs and the nursing notes.  The patient is on the cardiac monitor to evaluate for evidence of arrhythmia and/or significant heart rate changes. Patient's presentation is most consistent with acute presentation with potential threat to life or bodily function. Presents to the emergency department complaining of multiple symptoms concerning for high blood pressure. Patient is otherwise asymptomatic without confusion, chest pain, hematuria, or SOB. Denies nonadherence to antihypertensive regimen DDx: CV, AMI, heart failure, renal infarction or failure or other end organ damage. Patient also shows signs of bacterial leukocytosis and a UA concerning for possible UTI.  Will treat empirically with cefdinir Disposition: Discussed with patient their elevated blood pressure and need for close outpatient management of their hypertension. Will provide a prescription for  the patients previous antihypertensive medication and arrange for the patient to follow up in a primary care clinic    FINAL CLINICAL IMPRESSION(S) / ED DIAGNOSES   Final diagnoses:  Generalized weakness  Hypertension, unspecified type  Urinary tract infection without hematuria, site unspecified   Rx / DC Orders   ED Discharge Orders          Ordered    fosfomycin (MONUROL) 3 g PACK   Once        11/01/22 1432           Note:  This document was prepared using Dragon voice recognition software and may include unintentional dictation errors.   Merwyn Katos, MD 11/01/22 [phone]

## 2024-06-23 ENCOUNTER — Ambulatory Visit: Admit: 2024-06-23
